# Patient Record
Sex: Female | Born: 1935 | Race: White | Hispanic: No
Health system: Southern US, Community
[De-identification: ages and names within clinical notes are randomized; demographics above are authoritative.]

## PROBLEM LIST (undated history)

## (undated) ENCOUNTER — Emergency Department: Discharge status: Absent without leave | Payer: Self-pay

---

## 2004-07-20 ENCOUNTER — Ambulatory Visit: Payer: Self-pay | Admitting: Family Medicine

## 2004-07-26 ENCOUNTER — Ambulatory Visit: Payer: Self-pay | Admitting: Family Medicine

## 2005-09-15 ENCOUNTER — Ambulatory Visit: Payer: Self-pay | Admitting: Family Medicine

## 2006-12-05 ENCOUNTER — Emergency Department: Payer: Self-pay | Admitting: Emergency Medicine

## 2006-12-13 ENCOUNTER — Ambulatory Visit: Payer: Self-pay | Admitting: Physician Assistant

## 2008-01-19 ENCOUNTER — Other Ambulatory Visit: Payer: Self-pay

## 2008-01-19 ENCOUNTER — Inpatient Hospital Stay: Payer: Self-pay | Admitting: Specialist

## 2008-01-20 ENCOUNTER — Other Ambulatory Visit: Payer: Self-pay

## 2008-04-04 ENCOUNTER — Emergency Department: Payer: Self-pay | Admitting: Emergency Medicine

## 2008-04-04 ENCOUNTER — Other Ambulatory Visit: Payer: Self-pay

## 2008-10-21 ENCOUNTER — Ambulatory Visit: Payer: Self-pay | Admitting: Physician Assistant

## 2009-02-13 ENCOUNTER — Emergency Department: Payer: Self-pay | Admitting: Emergency Medicine

## 2009-02-26 ENCOUNTER — Ambulatory Visit: Payer: Self-pay | Admitting: Pain Medicine

## 2009-04-01 ENCOUNTER — Ambulatory Visit: Payer: Self-pay | Admitting: Family Medicine

## 2010-02-03 ENCOUNTER — Inpatient Hospital Stay: Payer: Self-pay | Admitting: Internal Medicine

## 2011-04-05 IMAGING — CT CT HEAD WITHOUT CONTRAST
2 series · 17 of 30 positions shown, 20 images · non-contrast
Comparison: none

REASON FOR EXAM: increased confusion
COMMENTS:

[Series 2: without · axial · non-contrast · 0.39mm/px · z∈[-81,+34]mm · 12 of 29 slices shown, 15 images]
[im 3/29  brain]
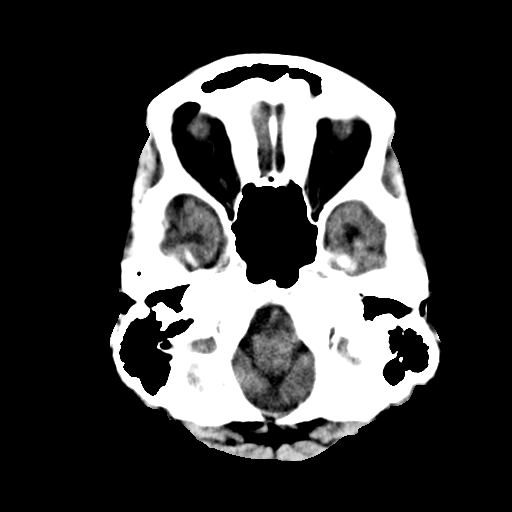
[im 3/29  bone]
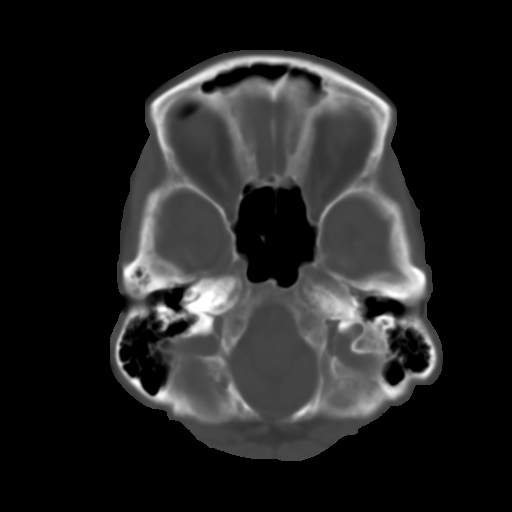
[im 5/29  brain]
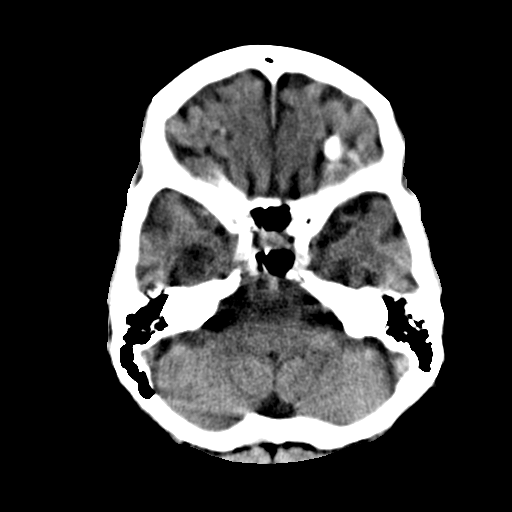
[im 7/29  brain]
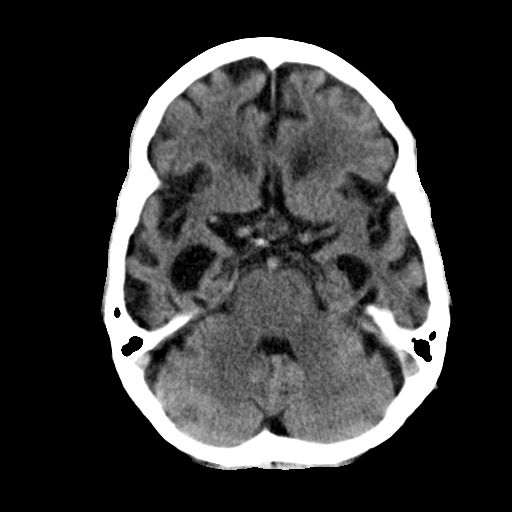
[im 9/29  brain]
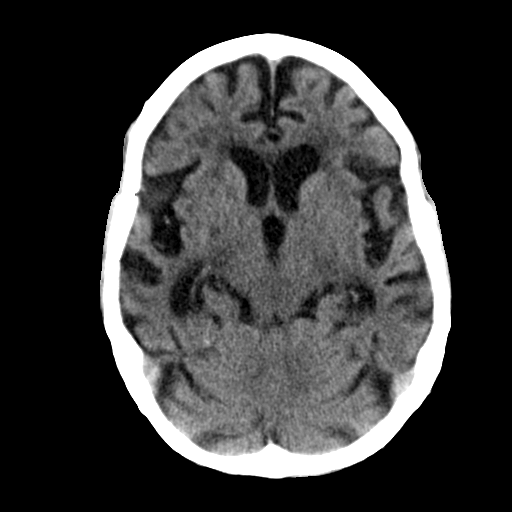
[im 11/29  brain]
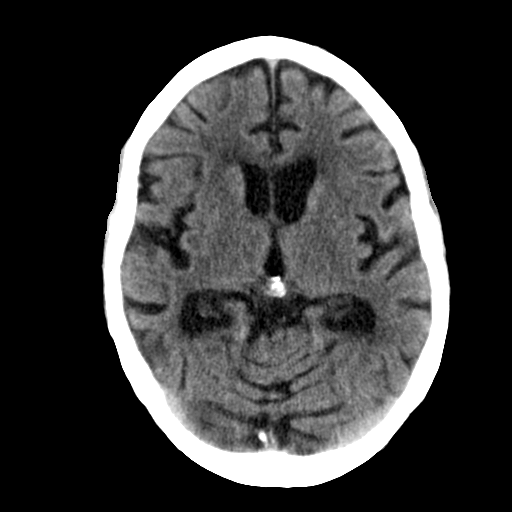
[im 11/29  bone]
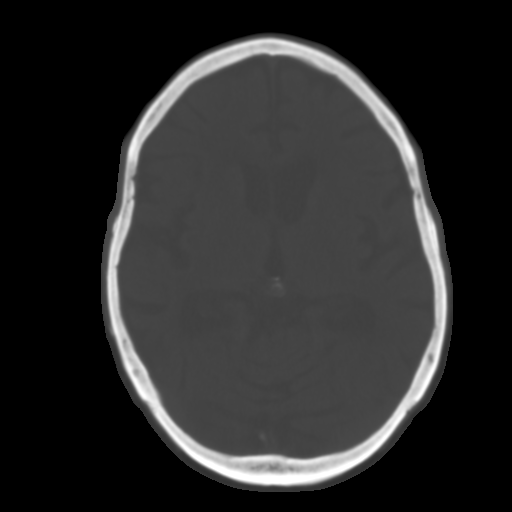
[im 13/29  brain]
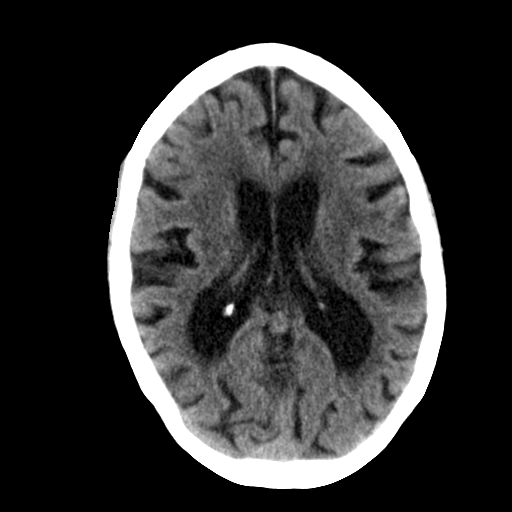
[im 16/29  brain]
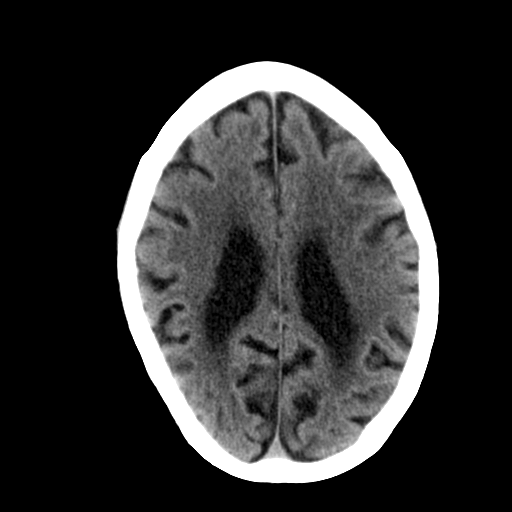
[im 18/29  brain]
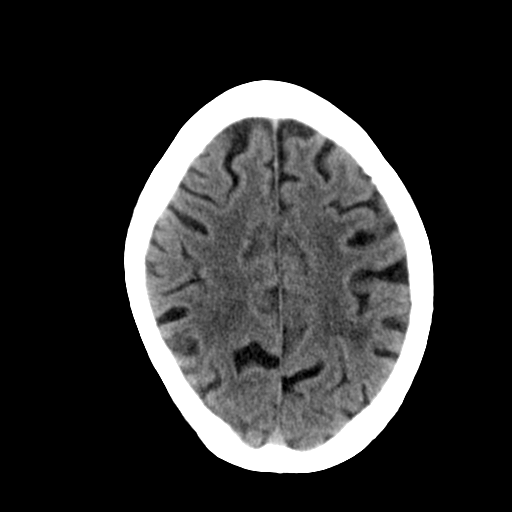
[im 20/29  brain]
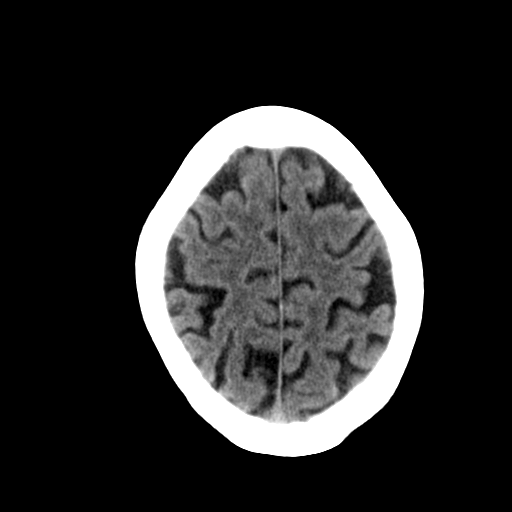
[im 20/29  bone]
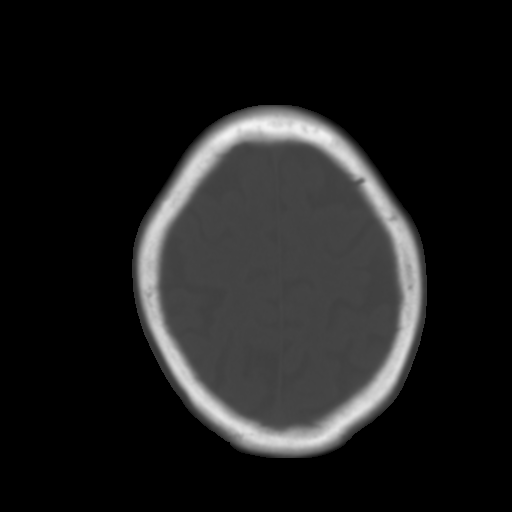
[im 22/29  brain]
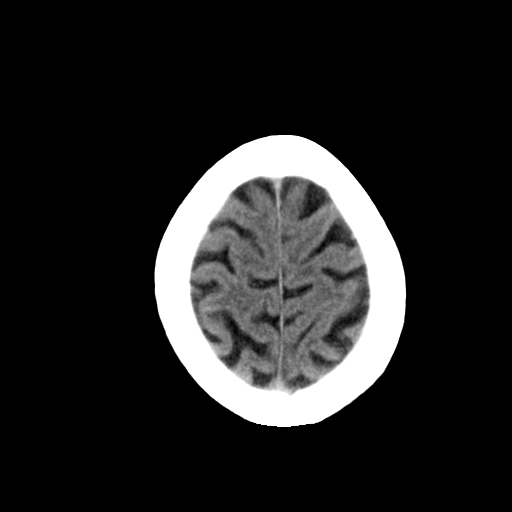
[im 24/29  brain]
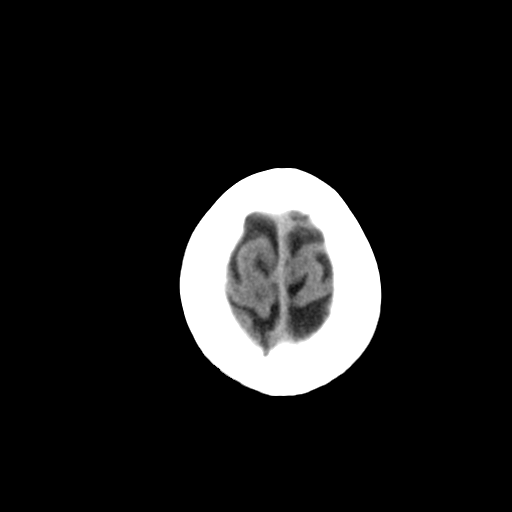
[im 26/29  brain]
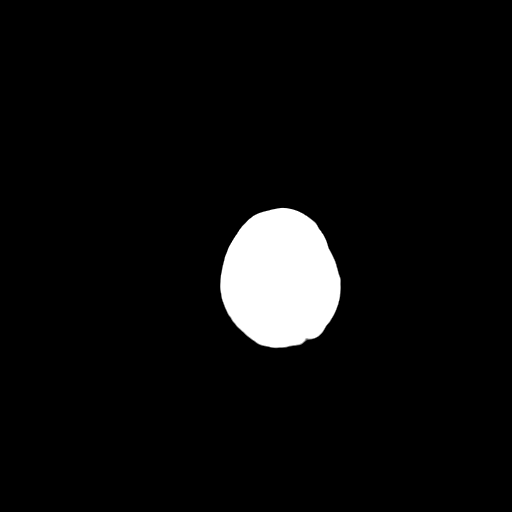

[Series 3: bone · axial · 0.39mm/px · z∈[-81,-11]mm · 5 of 31 slices shown]
[im 3/31  bone]
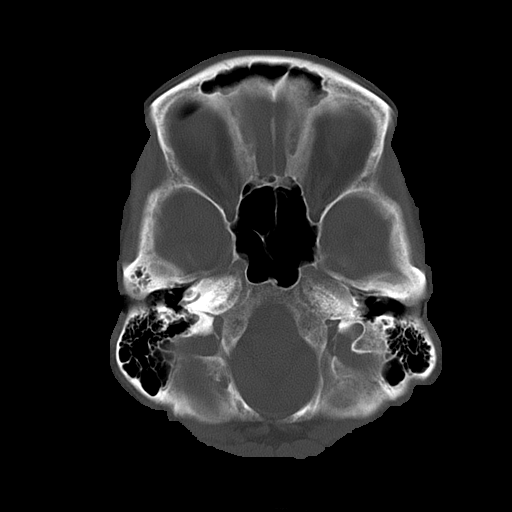
[im 7/31  bone]
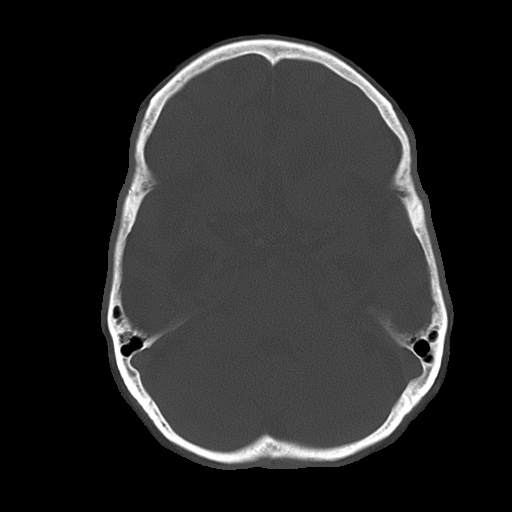
[im 11/31  bone]
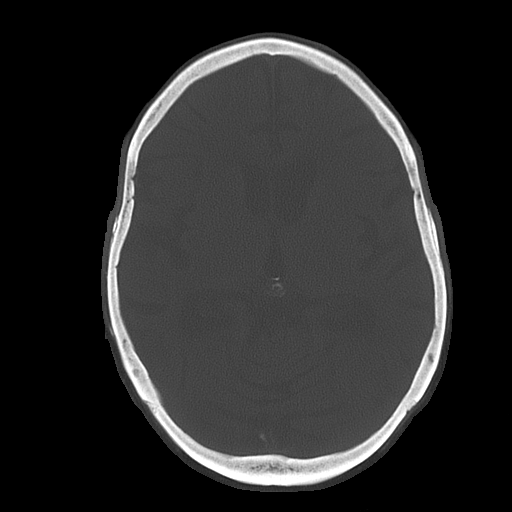
[im 15/31  bone]
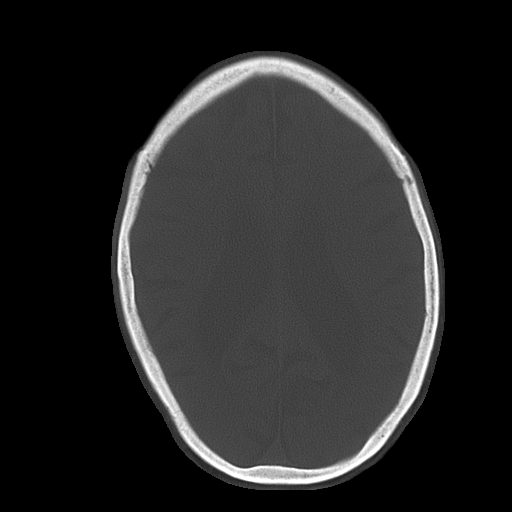
[im 17/31  bone]
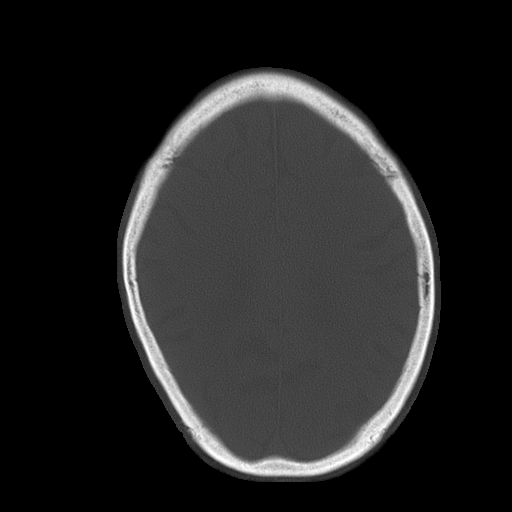

[17 of 30 positions shown; findings below may reference images not displayed]

PROCEDURE:     CT  - CT HEAD WITHOUT CONTRAST  - February 03, 2010  [DATE]

RESULT:     Axial noncontrast CT scanning was performed through the brain at
5 mm intervals and slice thicknesses.

There is moderate diffuse age-appropriate cerebral and cerebellar atrophy.
There is no evidence of an intracranial hemorrhage nor of intracranial mass
effect. There is mild compensatory ventriculomegaly present. There is no
evidence of an evolving ischemic infarction. At bone window settings the
observed portions of the paranasal sinuses are clear. I do not see evidence
of an acute skull fracture.
IMPRESSION: There are moderate diffuse atrophic changes of the cerebrum
and cerebellum. I do not see evidence of an acute intracranial hemorrhage
nor other acute intracranial abnormality.

## 2013-04-11 ENCOUNTER — Ambulatory Visit: Payer: Self-pay | Admitting: Nurse Practitioner

## 2013-04-12 ENCOUNTER — Ambulatory Visit: Payer: Self-pay | Admitting: Nurse Practitioner

## 2013-05-13 ENCOUNTER — Ambulatory Visit: Payer: Self-pay | Admitting: Nurse Practitioner

## 2013-06-12 ENCOUNTER — Ambulatory Visit: Payer: Self-pay | Admitting: Nurse Practitioner

## 2013-07-13 ENCOUNTER — Ambulatory Visit: Payer: Self-pay | Admitting: Nurse Practitioner

## 2013-08-12 ENCOUNTER — Ambulatory Visit: Payer: Self-pay | Admitting: Nurse Practitioner

## 2013-10-13 ENCOUNTER — Ambulatory Visit: Payer: Self-pay | Admitting: Nurse Practitioner

## 2013-12-11 ENCOUNTER — Ambulatory Visit: Payer: Self-pay | Admitting: Nurse Practitioner

## 2014-01-10 ENCOUNTER — Ambulatory Visit: Payer: Self-pay | Admitting: Nurse Practitioner

## 2014-03-12 ENCOUNTER — Ambulatory Visit
Admit: 2014-03-12 | Disposition: A | Discharge status: Home / Self Care | Payer: Self-pay | Attending: Nurse Practitioner | Admitting: Nurse Practitioner

## 2014-05-13 ENCOUNTER — Ambulatory Visit
Admit: 2014-05-13 | Disposition: A | Discharge status: Home / Self Care | Payer: Self-pay | Attending: Nurse Practitioner | Admitting: Nurse Practitioner

## 2014-06-12 ENCOUNTER — Ambulatory Visit: Payer: Self-pay | Admitting: Nurse Practitioner

## 2014-12-12 ENCOUNTER — Ambulatory Visit: Payer: Self-pay | Admitting: Nurse Practitioner

## 2014-12-12 DEATH — deceased

## 2020-03-31 NOTE — Progress Notes (Signed)
This encounter was created in error - please disregard.
